# Patient Record
Sex: Female | Born: 2005
Health system: Southern US, Community
[De-identification: ages and names within clinical notes are randomized; demographics above are authoritative.]

## PROBLEM LIST (undated history)

## (undated) DIAGNOSIS — M419 Scoliosis, unspecified: Secondary | ICD-10-CM

## (undated) DIAGNOSIS — G35 Multiple sclerosis: Secondary | ICD-10-CM

## (undated) HISTORY — DX: Scoliosis, unspecified: M41.9

## (undated) HISTORY — DX: Multiple sclerosis: G35

---
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
## 2024-01-19 ENCOUNTER — Telehealth: Payer: Self-pay | Admitting: *Deleted

## 2024-01-19 NOTE — Telephone Encounter (Signed)
 Faxed completed form below to [employer], received fax confirmation.

## 2024-02-25 DIAGNOSIS — G35A Relapsing-remitting multiple sclerosis: Secondary | ICD-10-CM | POA: Diagnosis not present
